# Patient Record
Sex: Female | Born: 2000 | Race: Black or African American | Hispanic: No | Marital: Single | Smoking: Never smoker
Health system: Southern US, Community
[De-identification: ages and names within clinical notes are randomized; demographics above are authoritative.]

## PROBLEM LIST (undated history)

## (undated) DIAGNOSIS — K2960 Other gastritis without bleeding: Secondary | ICD-10-CM

---

## 2020-05-13 ENCOUNTER — Emergency Department (HOSPITAL_BASED_OUTPATIENT_CLINIC_OR_DEPARTMENT_OTHER): Payer: Medicaid Other

## 2020-05-13 ENCOUNTER — Other Ambulatory Visit: Payer: Self-pay

## 2020-05-13 ENCOUNTER — Emergency Department (HOSPITAL_BASED_OUTPATIENT_CLINIC_OR_DEPARTMENT_OTHER)
Admission: EM | Admit: 2020-05-13 | Discharge: 2020-05-13 | Disposition: A | Discharge status: Home / Self Care | Payer: Medicaid Other | Attending: Emergency Medicine | Admitting: Emergency Medicine

## 2020-05-13 ENCOUNTER — Encounter (HOSPITAL_BASED_OUTPATIENT_CLINIC_OR_DEPARTMENT_OTHER): Payer: Self-pay | Admitting: *Deleted

## 2020-05-13 DIAGNOSIS — M79604 Pain in right leg: Secondary | ICD-10-CM | POA: Diagnosis present

## 2020-05-13 HISTORY — DX: Other gastritis without bleeding: K29.60

## 2020-05-13 MED ORDER — LIDOCAINE 5 % EX PTCH: 1.0000 | MEDICATED_PATCH | CUTANEOUS | 0 refills | Status: AC

## 2020-05-13 MED ORDER — NAPROXEN 500 MG PO TABS: 500.0000 mg | ORAL_TABLET | Freq: Two times a day (BID) | ORAL | 0 refills | Status: DC

## 2020-05-13 NOTE — Discharge Instructions (Signed)
Recommend Tylenol as needed for pain.  Make sure to ice and elevate your leg.  Follow-up with orthopedic if her symptoms are unresolved.  Return for any worsening symptoms.

## 2020-05-13 NOTE — ED Triage Notes (Signed)
Pt reports right knee pain after playing outside yesterday. Able to wb with pain, denies any specific injury or trauma, denies any other c/o.

## 2020-05-13 NOTE — ED Provider Notes (Signed)
[facility] EMERGENCY DEPARTMENT Provider Note   CSN: [id number] Arrival date & time: 05/13/20  4098    History Right leg pain  Jane Allen is a 19 y.o. female with no significant past medical history who presents for evaluation of right leg pain.  Began yesterday.  She denies any recent injury or trauma.  States she has pain located to her popliteal fossa on her right knee.  Will intermittently extend into her posterior right calf.  No recent surgery, immobilization, birth control, history of PE or DVT.  She denies any chest pain, shortness of breath.  No redness or swelling to legs.  Hurts more when she dorsiflexes her foot.  She denies any bony tenderness. States she was playing outside yesterday however she was fine after this and did not have any increase in pain.  She has never had anything like this before.  She is not take anything for symptoms.  She rates her pain a 5/10.  Has been able to ambulate however states she feels tightness to her posterior aspect of her calf. No bony pain. Denies additional aggravating or alleviating factors.   Denies change of pregnancy  History pain from patient and past medical records.  No interpreter used    HPI     Past Medical History:  Diagnosis Date  . Reflux gastritis     There are no problems to display for this patient.   History reviewed. No pertinent surgical history.   OB History   No obstetric history on file.     History reviewed. No pertinent family history.  Social History   Tobacco Use  . Smoking status: Never Smoker  . Smokeless tobacco: Never Used    Home Medications Prior to Admission medications   Medication Sig Start Date End Date Taking? Authorizing Provider  lidocaine (LIDODERM) 5 % Place 1 patch onto the skin daily. Remove & Discard patch within 12 hours or as directed by MD 05/13/20  Yes Harmani Neto A, PA-C  naproxen (NAPROSYN) 500 MG tablet Take 1 tablet (500 mg total) by mouth 2  (two) times daily. 05/13/20  Yes Briony Parveen A, PA-C    Allergies    Patient has no known allergies.  Review of Systems   Review of Systems  Constitutional: Negative.   HENT: Negative.   Respiratory: Negative.   Cardiovascular: Negative.   Gastrointestinal: Negative.   Genitourinary: Negative.   Musculoskeletal: Negative for gait problem.       Right calf pain  Skin: Negative.   Neurological: Negative.   All other systems reviewed and are negative.   Physical Exam Updated Vital Signs BP 120/78 (BP Location: Right Arm)   Pulse 78   Temp 98.2 F (36.8 C) (Oral)   Resp 18   Ht 5\' 1"  (1.549 m)   Wt 56.2 kg   SpO2 100%   BMI 23.43 kg/m   Physical Exam Vitals and nursing note reviewed.  Constitutional:      General: She is not in acute distress.    Appearance: She is well-developed and well-nourished. She is not ill-appearing, toxic-appearing or diaphoretic.  HENT:     Head: Normocephalic and atraumatic.     Nose: Nose normal.     Mouth/Throat:     Mouth: Mucous membranes are moist.  Eyes:     Pupils: Pupils are equal, round, and reactive to light.  Cardiovascular:     Rate and Rhythm: Normal rate.     Pulses: Normal pulses and intact  distal pulses.     Heart sounds: Normal heart sounds.  Pulmonary:     Effort: Pulmonary effort is normal. No respiratory distress.     Breath sounds: Normal breath sounds.  Abdominal:     General: Bowel sounds are normal. There is no distension.  Musculoskeletal:        General: Normal range of motion.     Cervical back: Normal range of motion.     Comments: No bony tenderness.  Moves all 4 extremities without difficulty.  Compartments soft.  Does have some soft tissue tenderness to her right popliteal fossa, right mid calf.  Negative Thompson test.  Negative Homans' sign.  Skin:    General: Skin is warm and dry.     Capillary Refill: Capillary refill takes less than 2 seconds.     Comments: No edema, erythema or warmth.  No  fluctuance or induration.  Neurological:     General: No focal deficit present.     Mental Status: She is alert and oriented to person, place, and time.  Psychiatric:        Mood and Affect: Mood and affect normal.    ED Results / Procedures / Treatments   Labs (all labs ordered are listed, but only abnormal results are displayed) Labs Reviewed - No data to display  EKG None  Radiology US Venous Img Lower Unilateral Right  Result Date: 05/13/2020 CLINICAL DATA:  Right popliteal vein EXAM: RIGHT LOWER EXTREMITY VENOUS DOPPLER ULTRASOUND TECHNIQUE: Gray-scale sonography with compression, as well as color and duplex ultrasound, were performed to evaluate the deep venous system(s) from the level of the common femoral vein through the popliteal and proximal calf veins. COMPARISON:  None. FINDINGS: VENOUS Normal compressibility of the common femoral, superficial femoral, and popliteal veins, as well as the visualized calf veins. Visualized portions of profunda femoral vein and great saphenous vein unremarkable. No filling defects to suggest DVT on grayscale or color Doppler imaging. Doppler waveforms show normal direction of venous flow, normal respiratory plasticity and response to augmentation. Limited views of the contralateral common femoral vein are unremarkable. OTHER None. Limitations: none IMPRESSION: Negative. Electronically Signed   By: Sharlet Salina M.D.   On: 05/13/2020 15:30   DG Knee Complete 4 Views Right  Result Date: 05/13/2020 CLINICAL DATA:  Right knee pain today. Patient was playing outside yesterday. No specific injury. EXAM: RIGHT KNEE - COMPLETE 4+ VIEW COMPARISON:  None. FINDINGS: No evidence of fracture, dislocation, or joint effusion. No evidence of arthropathy or other focal bone abnormality. Soft tissues are unremarkable. IMPRESSION: Negative. Electronically Signed   By: Amie Portland M.D.   On: 05/13/2020 12:19   Procedures .Ortho Injury Treatment  Date/Time:  05/13/2020 3:58 PM Performed by: Linwood Dibbles, PA-C Authorized by: Linwood Dibbles, PA-C   Consent:    Consent obtained:  Verbal   Consent given by:  Patient and parent   Risks discussed:  Fracture, nerve damage, restricted joint movement, vascular damage, stiffness, recurrent dislocation and irreducible dislocation   Alternatives discussed:  No treatment, alternative treatment, immobilization, referral and delayed treatmentInjury location: lower leg Location details: right lower leg Injury type: soft tissue Pre-procedure neurovascular assessment: neurovascularly intact Pre-procedure distal perfusion: normal Pre-procedure neurological function: normal Pre-procedure range of motion: normal  Anesthesia: Local anesthesia used: no  Patient sedated: NoImmobilization: crutches Post-procedure neurovascular assessment: post-procedure neurovascularly intact Post-procedure distal perfusion: normal Post-procedure neurological function: normal Post-procedure range of motion: normal Patient tolerance: patient tolerated the procedure well with  no immediate complications    (including critical care time)  Medications Ordered in ED Medications - No data to display  ED Course  I have reviewed the triage vital signs and the nursing notes.  Pertinent labs & imaging results that were available during my care of the patient were reviewed by me and considered in my medical decision making (see chart for details).  19 year old presents for evaluation of right lower extremity pain.  She is afebrile, nonseptic, non-ill-appearing.  Pain worse with stretching her leg.  She has no edema, erythema or warmth.  No fluctuance or induration.  No clinical evidence of cellulitis or abscess on exam.  Her compartments are soft.  Does have tenderness to her popliteal fossa as well as over her mid gastroc muscles.  She is neurovascularly intact.  X-ray knee obtained from triage is without fracture,  dislocation or effusion.  Her ultrasound here is negative for DVT.  Suspect tendinitis.  She was given crutches for comfort.  No evidence of acute Achilles tendon rupture.  Discussed NSAIDs, ice, elevation, rest and follow-up with orthopedics.  She is agreeable for this.  The patient has been appropriately medically screened and/or stabilized in the ED. I have low suspicion for any other emergent medical condition which would require further screening, evaluation or treatment in the ED or require inpatient management.  Patient is hemodynamically stable and in no acute distress.  Patient able to ambulate in department prior to ED.  Evaluation does not show acute pathology that would require ongoing or additional emergent interventions while in the emergency department or further inpatient treatment.  I have discussed the diagnosis with the patient and answered all questions.  Pain is been managed while in the emergency department and patient has no further complaints prior to discharge.  Patient is comfortable with plan discussed in room and is stable for discharge at this time.  I have discussed strict return precautions for returning to the emergency department.  Patient was encouraged to follow-up with PCP/specialist refer to at discharge.     MDM Rules/Calculators/A&P                           Final Clinical Impression(s) / ED Diagnoses Final diagnoses:  Right leg pain    Rx / DC Orders ED Discharge Orders         Ordered    naproxen (NAPROSYN) 500 MG tablet  2 times daily        05/13/20 1557    lidocaine (LIDODERM) 5 %  Every 24 hours        05/13/20 1557           Jacquelina Hewins A, PA-C 05/13/20 1558    Tilden Fossa, MD 05/14/20 [phone]

## 2020-06-23 ENCOUNTER — Encounter (HOSPITAL_BASED_OUTPATIENT_CLINIC_OR_DEPARTMENT_OTHER): Payer: Self-pay

## 2020-06-23 ENCOUNTER — Other Ambulatory Visit: Payer: Self-pay

## 2020-06-23 ENCOUNTER — Emergency Department (HOSPITAL_BASED_OUTPATIENT_CLINIC_OR_DEPARTMENT_OTHER)
Admission: EM | Admit: 2020-06-23 | Discharge: 2020-06-23 | Disposition: A | Discharge status: Home / Self Care | Payer: Medicaid Other | Attending: Emergency Medicine | Admitting: Emergency Medicine

## 2020-06-23 DIAGNOSIS — M25551 Pain in right hip: Secondary | ICD-10-CM | POA: Insufficient documentation

## 2020-06-23 LAB — PREGNANCY, URINE: Preg Test, Ur: NEGATIVE

## 2020-06-23 MED ORDER — IBUPROFEN 800 MG PO TABS
800.0000 mg | ORAL_TABLET | Freq: Once | ORAL | Status: AC
  Administered 2020-06-23: 800 mg via ORAL
  Filled 2020-06-23: qty 1

## 2020-06-23 MED ORDER — DICLOFENAC SODIUM 1 % EX GEL: 2.0000 g | Freq: Four times a day (QID) | CUTANEOUS | 0 refills | Status: DC

## 2020-06-23 NOTE — ED Provider Notes (Signed)
[facility] EMERGENCY DEPARTMENT Provider Note   CSN: [id number] Arrival date & time: 06/23/20  1334     History Chief Complaint  Patient presents with  . Hip Pain    Jane Allen is a 20 y.o. female.  HPI 20 year old female who presents to the ER with 2 days of right hip pain. Patient reports significant pain with ambulation which began on Tuesday. She states that she was helping carry a gate to the driveway. She denies any numbness drinking, or loss of bowel bladder control. No history of IV drug use, no fevers or chills. No unintended weight loss. Denies any falls or injuries. She has not taken anything for her symptoms. Has pain with ambulation.  Past Medical History:  Diagnosis Date  . Reflux gastritis     There are no problems to display for this patient.   History reviewed. No pertinent surgical history.   OB History   No obstetric history on file.     No family history on file.  Social History   Tobacco Use  . Smoking status: Never Smoker  . Smokeless tobacco: Never Used  Vaping Use  . Vaping Use: Never used  Substance Use Topics  . Alcohol use: Never  . Drug use: Never    Home Medications Prior to Admission medications   Medication Sig Start Date End Date Taking? Authorizing Provider  diclofenac Sodium (VOLTAREN) 1 % GEL Apply 2 g topically 4 (four) times daily. 06/23/20  Yes Trudee Grip A, PA-C  lidocaine (LIDODERM) 5 % Place 1 patch onto the skin daily. Remove & Discard patch within 12 hours or as directed by MD 05/13/20   Henderly, Britni A, PA-C  naproxen (NAPROSYN) 500 MG tablet Take 1 tablet (500 mg total) by mouth 2 (two) times daily. 05/13/20   Henderly, Britni A, PA-C    Allergies    Patient has no known allergies.  Review of Systems   Review of Systems  Constitutional: Negative for chills and fever.  Musculoskeletal: Positive for arthralgias and gait problem. Negative for joint swelling.  Neurological: Negative for weakness  and numbness.    Physical Exam Updated Vital Signs BP 104/87 (BP Location: Right Arm)   Pulse 77   Temp 98.3 F (36.8 C) (Oral)   Resp 18   Ht 5\' 1"  (1.549 m)   Wt 59.4 kg   LMP 06/15/2020   SpO2 100%   BMI 24.75 kg/m   Physical Exam Vitals and nursing note reviewed.  Constitutional:      General: She is not in acute distress.    Appearance: She is well-developed and well-nourished. She is not ill-appearing or diaphoretic.  HENT:     Head: Normocephalic and atraumatic.  Eyes:     Conjunctiva/sclera: Conjunctivae normal.  Cardiovascular:     Rate and Rhythm: Normal rate and regular rhythm.     Heart sounds: No murmur heard.   Pulmonary:     Effort: Pulmonary effort is normal. No respiratory distress.     Breath sounds: Normal breath sounds.  Abdominal:     Palpations: Abdomen is soft.     Tenderness: There is no abdominal tenderness.  Musculoskeletal:        General: Tenderness present. No swelling, deformity, signs of injury or edema.     Cervical back: Neck supple.     Right lower leg: No edema.     Left lower leg: No edema.     Comments: Mild tenderness to palpation over the right  greater trochanter on the right hip.  She has full passive and active flexion and extension of the right hip.  Intact plantar flexion dorsiflexion of the ankle, 5/5 strength in lower extremities, mild limp with ambulation secondary to pain but able to bear weight with no difficulty.  Skin:    General: Skin is warm and dry.  Neurological:     General: No focal deficit present.     Mental Status: She is alert and oriented to person, place, and time.  Psychiatric:        Mood and Affect: Mood and affect and mood normal.        Behavior: Behavior normal.     ED Results / Procedures / Treatments   Labs (all labs ordered are listed, but only abnormal results are displayed) Labs Reviewed  PREGNANCY, URINE    EKG None  Radiology No results found.  Procedures Procedures    Medications Ordered in ED Medications  ibuprofen (ADVIL) tablet 800 mg (800 mg Oral Given 06/23/20 1637)    ED Course  I have reviewed the triage vital signs and the nursing notes.  Pertinent labs & imaging results that were available during my care of the patient were reviewed by me and considered in my medical decision making (see chart for details).    MDM Rules/Calculators/A&P                          20 year old female with 2 days of right hip pain.  On arrival, vitals reassuring, physical exam reassuring, full range of motion of the right hip, strength and sensations are intact.  Denies any trauma.  We discussed possible plain films, however given no recent trauma or falls, with reassuring physical exam, low suspicion for fractures or dislocations.  Patient elected forego imaging at this time.  She was ambulated being here in the ER with a mild limp but with no significant difficulty.  I encouraged ibuprofen for pain, will provide Voltaren gel as well.  Encouraged gentle stretching.  Stressed PCP follow-up if this continues.  We discussed return precautions.  All of her questions been answered to her satisfaction, she voiced understanding is agreeable.  At this stage in the ED course, the patient is medically screened and stable for discharge Final Clinical Impression(s) / ED Diagnoses Final diagnoses:  Right hip pain    Rx / DC Orders ED Discharge Orders         Ordered    diclofenac Sodium (VOLTAREN) 1 % GEL  4 times daily        06/23/20 1702           Leone Brand 06/23/20 1712    Pricilla Loveless, MD 06/23/20 [phone]

## 2020-06-23 NOTE — Discharge Instructions (Signed)
Please take Tylenol or ibuprofen for your symptoms. You may also use the topical anti-inflammatory cream that I have prescribed. Apply ice or heat to your hip as well. Use gentle stretching, examples provided you may take up to 800 mg of ibuprofen up to 3 times daily. Please make sure to follow-up with your primary care doctor for symptoms continue.

## 2020-06-23 NOTE — ED Triage Notes (Signed)
Pt c/o pain to right hip x 2 days "feels like a knot"-denies injury-NAD-limping gait

## 2021-03-19 ENCOUNTER — Emergency Department (HOSPITAL_BASED_OUTPATIENT_CLINIC_OR_DEPARTMENT_OTHER)
Admission: EM | Admit: 2021-03-19 | Discharge: 2021-03-20 | Disposition: A | Discharge status: Home / Self Care | Payer: Medicaid Other | Attending: Emergency Medicine | Admitting: Emergency Medicine

## 2021-03-19 ENCOUNTER — Encounter (HOSPITAL_BASED_OUTPATIENT_CLINIC_OR_DEPARTMENT_OTHER): Payer: Self-pay | Admitting: *Deleted

## 2021-03-19 ENCOUNTER — Other Ambulatory Visit: Payer: Self-pay

## 2021-03-19 DIAGNOSIS — Z5321 Procedure and treatment not carried out due to patient leaving prior to being seen by health care provider: Secondary | ICD-10-CM | POA: Diagnosis not present

## 2021-03-19 DIAGNOSIS — R509 Fever, unspecified: Secondary | ICD-10-CM | POA: Diagnosis not present

## 2021-03-19 DIAGNOSIS — R059 Cough, unspecified: Secondary | ICD-10-CM | POA: Diagnosis present

## 2021-03-19 LAB — RAPID INFLUENZA A&B ANTIGENS
Influenza A (ARMC): NEGATIVE
Influenza B (ARMC): NEGATIVE

## 2021-03-19 NOTE — ED Triage Notes (Addendum)
Cough and chills x 3 days- has had contacts +flu- also fever- took tylenol pta

## 2021-03-21 ENCOUNTER — Encounter (HOSPITAL_BASED_OUTPATIENT_CLINIC_OR_DEPARTMENT_OTHER): Payer: Self-pay | Admitting: Emergency Medicine

## 2021-03-21 ENCOUNTER — Other Ambulatory Visit: Payer: Self-pay

## 2021-03-21 ENCOUNTER — Emergency Department (HOSPITAL_BASED_OUTPATIENT_CLINIC_OR_DEPARTMENT_OTHER)
Admission: EM | Admit: 2021-03-21 | Discharge: 2021-03-21 | Disposition: A | Discharge status: Home / Self Care | Payer: Medicaid Other | Attending: Emergency Medicine | Admitting: Emergency Medicine

## 2021-03-21 DIAGNOSIS — B349 Viral infection, unspecified: Secondary | ICD-10-CM | POA: Insufficient documentation

## 2021-03-21 DIAGNOSIS — R509 Fever, unspecified: Secondary | ICD-10-CM | POA: Diagnosis present

## 2021-03-21 NOTE — ED Provider Notes (Signed)
[facility] EMERGENCY DEPARTMENT Provider Note  CSN: [id number] Arrival date & time: 03/21/21 0012    History Chief Complaint  Patient presents with   flu like symptoms     Jane Allen is a 20 y.o. female reports 3 days of viral symptoms, subjective fever, cough, congestion. Here yesterday and had neg influenza only swab but did not stay to be seen.    Past Medical History:  Diagnosis Date   Reflux gastritis     History reviewed. No pertinent surgical history.  Family History  Problem Relation Age of Onset   Hypertension Mother    Hypertension Other     Social History   Tobacco Use   Smoking status: Never   Smokeless tobacco: Never  Vaping Use   Vaping Use: Some days   Substances: Nicotine  Substance Use Topics   Alcohol use: Never   Drug use: Not Currently    Types: Marijuana     Home Medications Prior to Admission medications   Medication Sig Start Date End Date Taking? Authorizing Provider  diclofenac Sodium (VOLTAREN) 1 % GEL Apply 2 g topically 4 (four) times daily. 06/23/20   Mare Ferrari, PA-C  lidocaine (LIDODERM) 5 % Place 1 patch onto the skin daily. Remove & Discard patch within 12 hours or as directed by MD 05/13/20   Henderly, Britni A, PA-C  naproxen (NAPROSYN) 500 MG tablet Take 1 tablet (500 mg total) by mouth 2 (two) times daily. 05/13/20   Henderly, Britni A, PA-C     Allergies    Patient has no known allergies.   Review of Systems   Review of Systems .A comprehensive review of systems was completed and negative except as noted in HPI.     Physical Exam BP 140/90 (BP Location: Right Arm)   Pulse 72   Temp 98.3 F (36.8 C) (Oral)   Resp 14   Ht 5\' 1"  (1.549 m)   Wt 59 kg   LMP 02/23/2021 (Exact Date)   SpO2 100%   BMI 24.56 kg/m   Physical Exam Vitals and nursing note reviewed.  Constitutional:      Appearance: Normal appearance.  HENT:     Head: Normocephalic and atraumatic.     Nose: Nose normal.      Mouth/Throat:     Mouth: Mucous membranes are moist.  Eyes:     Extraocular Movements: Extraocular movements intact.     Conjunctiva/sclera: Conjunctivae normal.  Cardiovascular:     Rate and Rhythm: Normal rate.  Pulmonary:     Effort: Pulmonary effort is normal.     Breath sounds: Normal breath sounds.  Abdominal:     General: Abdomen is flat.     Palpations: Abdomen is soft.     Tenderness: There is no abdominal tenderness.  Musculoskeletal:        General: No swelling. Normal range of motion.     Cervical back: Neck supple.  Skin:    General: Skin is warm and dry.  Neurological:     General: No focal deficit present.     Mental Status: She is alert.  Psychiatric:        Mood and Affect: Mood normal.     ED Results / Procedures / Treatments   Labs (all labs ordered are listed, but only abnormal results are displayed) Labs Reviewed - No data to display  EKG None   Radiology No results found.  Procedures Procedures  Medications Ordered in the ED Medications -  No data to display   MDM Rules/Calculators/A&P MDM Patient with viral syndrome, recommend OTC symptomatic care. Isolation until improved.   ED Course  I have reviewed the triage vital signs and the nursing notes.  Pertinent labs & imaging results that were available during my care of the patient were reviewed by me and considered in my medical decision making (see chart for details).     Final Clinical Impression(s) / ED Diagnoses Final diagnoses:  Viral syndrome    Rx / DC Orders ED Discharge Orders     None        Pollyann Savoy, MD 03/21/21 0128

## 2021-03-21 NOTE — ED Triage Notes (Signed)
Pt is c/o shortness of breath, congestion, back pain and nausea  Pt states her sxs started 2 to 3 days ago

## 2021-12-02 IMAGING — CR DG KNEE COMPLETE 4+V*R*
4 series · 4 of 4 positions shown · non-contrast
Comparison: None.

CLINICAL DATA: Right knee pain today. Patient was playing outside
yesterday. No specific injury.

EXAM:
RIGHT KNEE - COMPLETE 4+ VIEW

[t knee ap right]
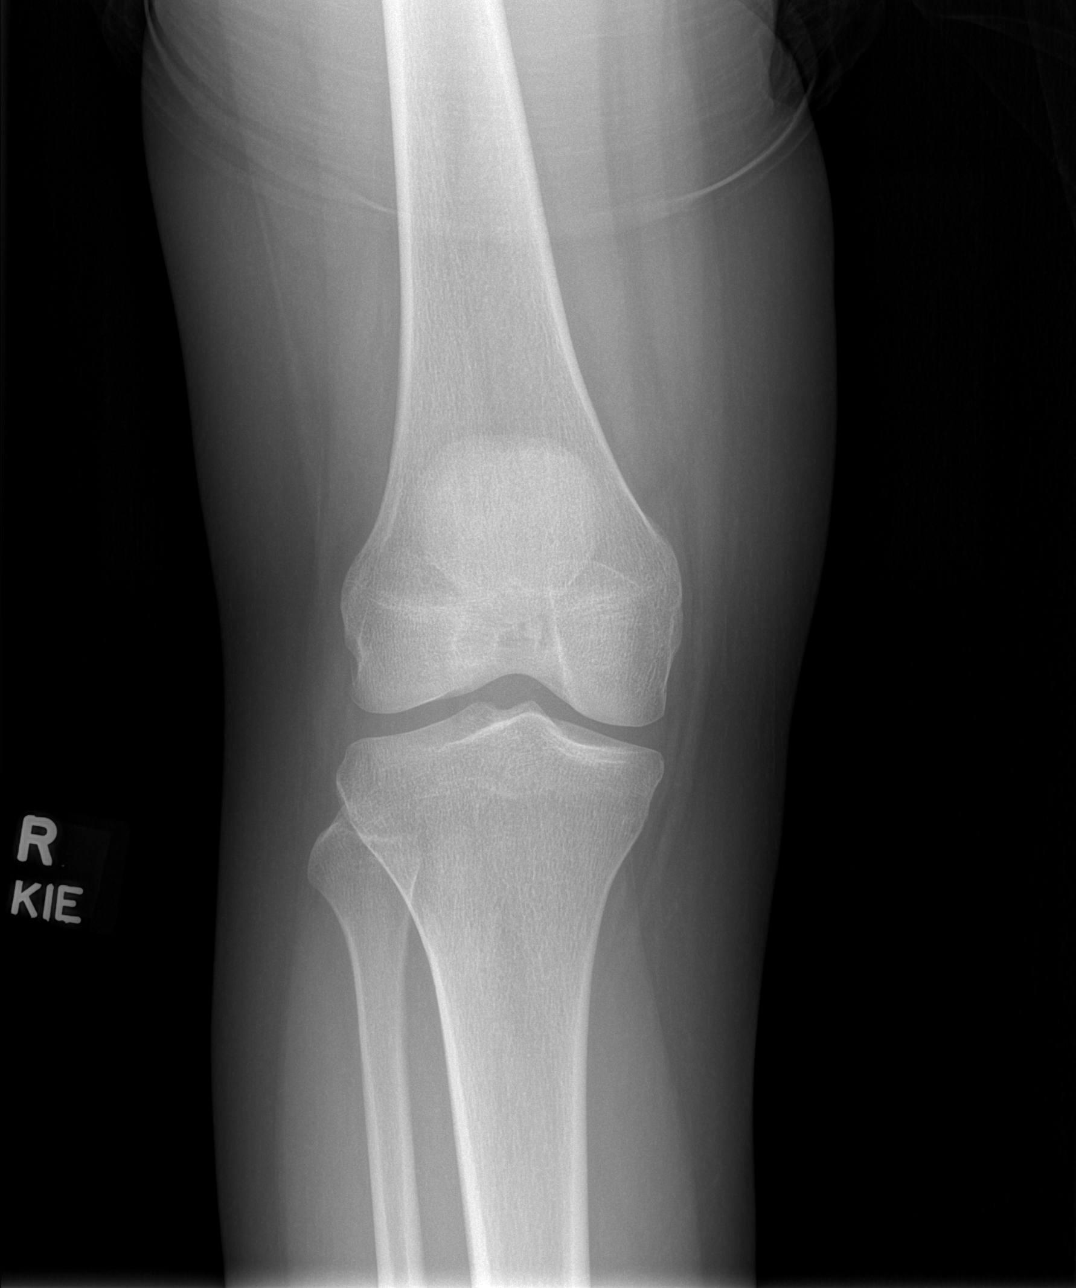

[t knee oblique right (1 of 2)]
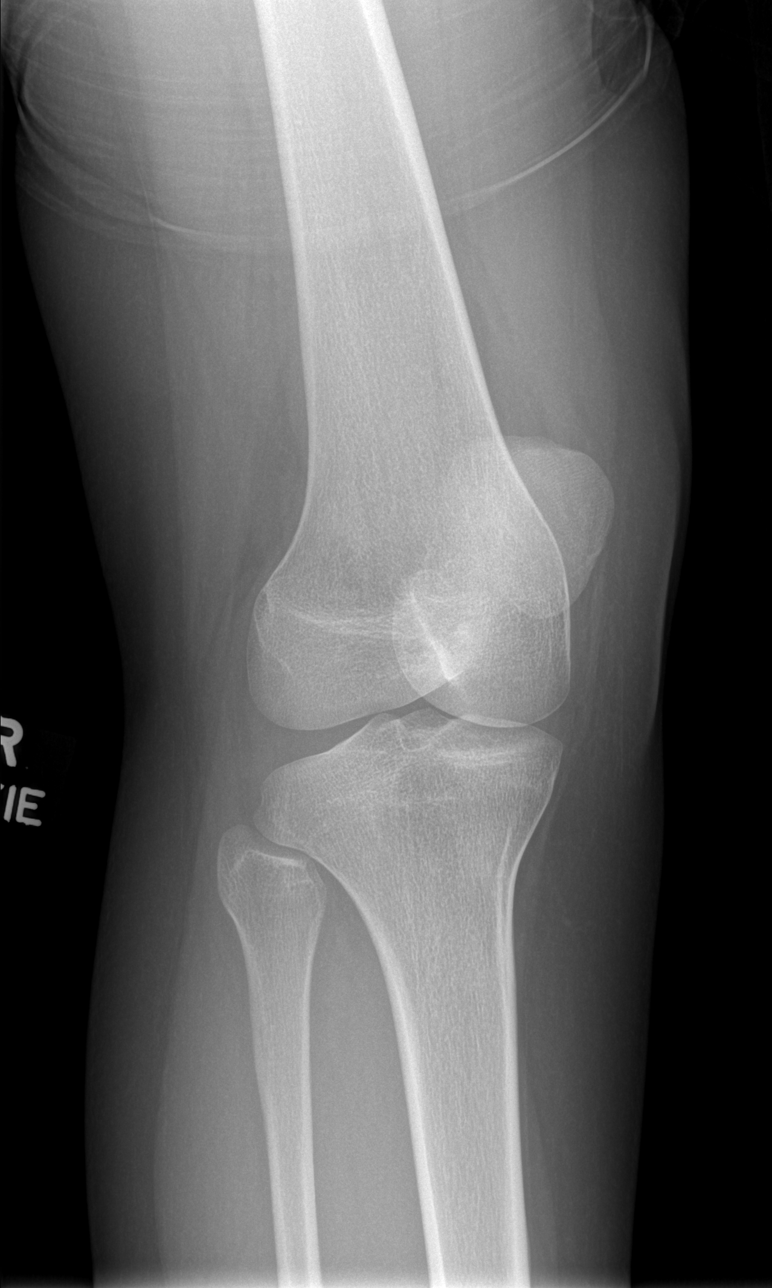

[t knee oblique right (2 of 2)]
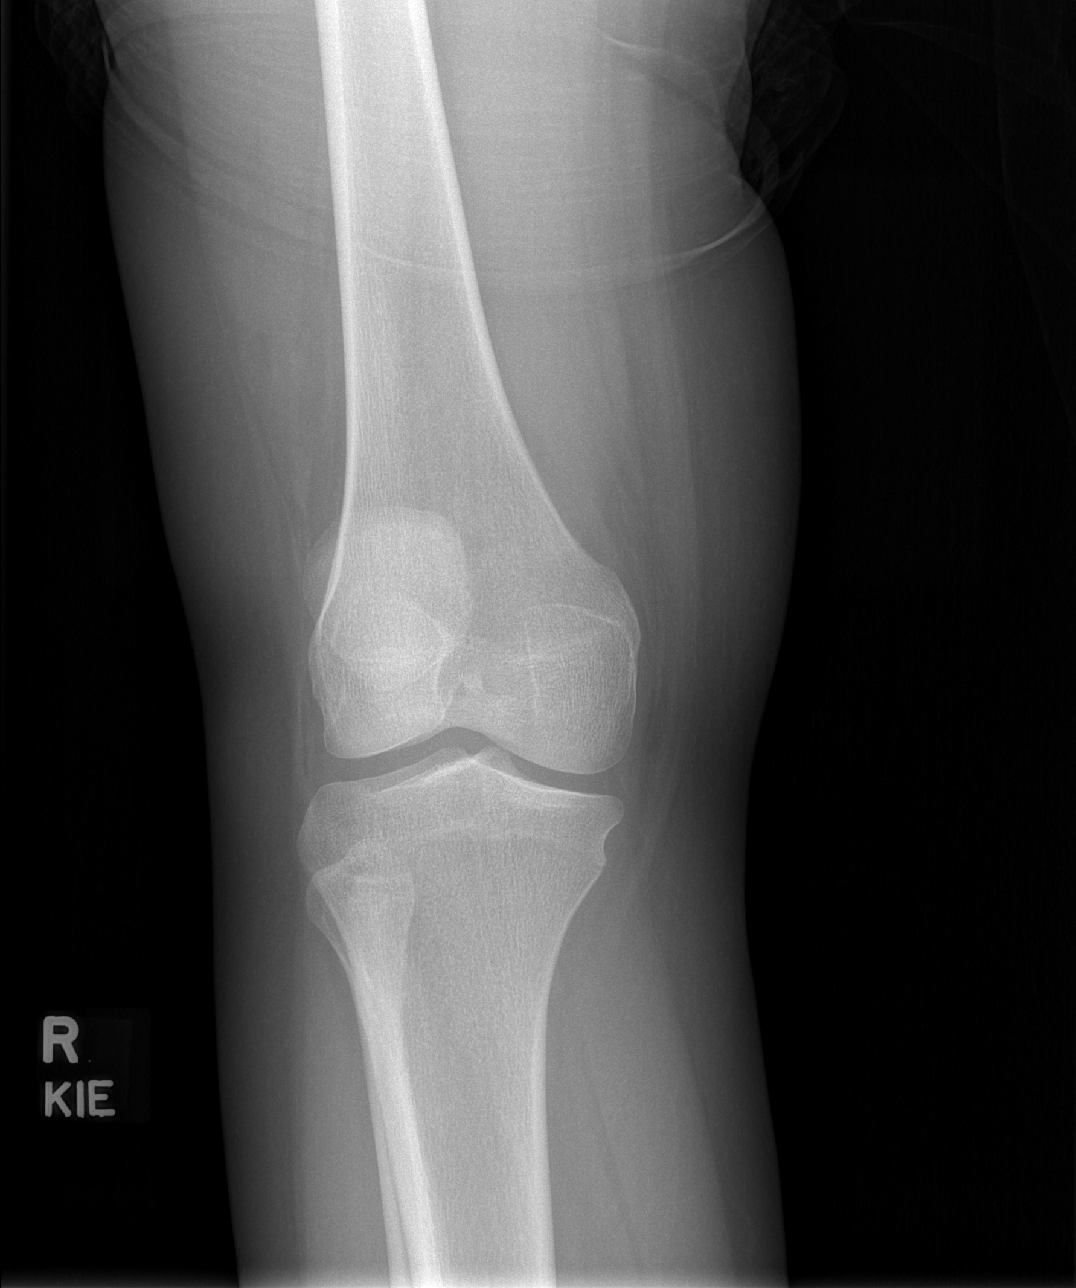

[t knee lat right]
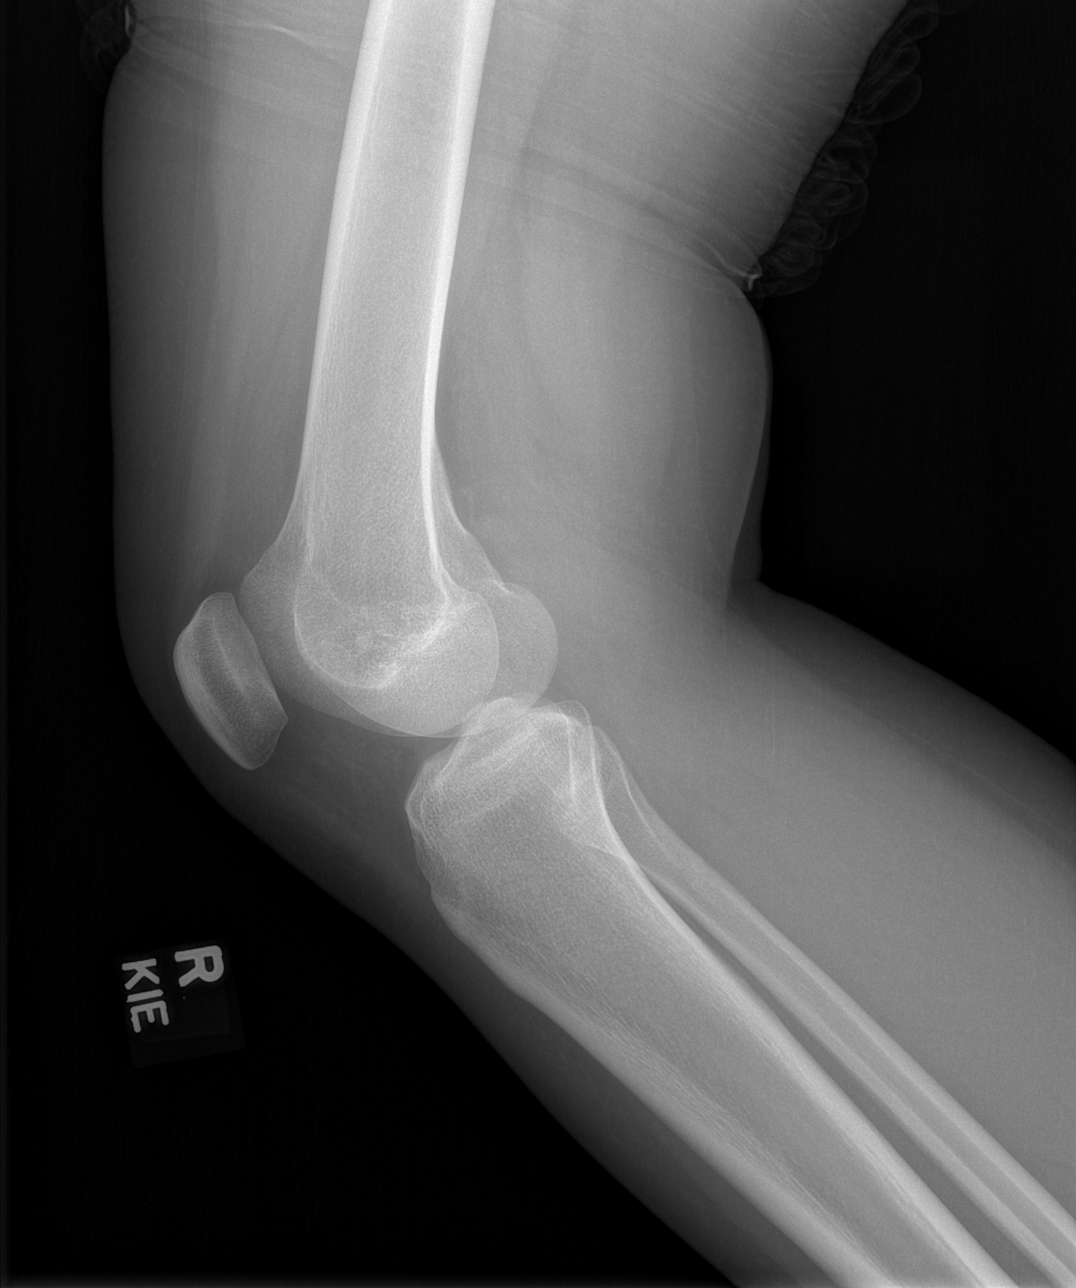

[4 of 4 positions shown; findings below may reference images not displayed]

FINDINGS: No evidence of fracture, dislocation, or joint effusion. No evidence
of arthropathy or other focal bone abnormality. Soft tissues are
unremarkable.
IMPRESSION: Negative.

## 2022-03-31 ENCOUNTER — Other Ambulatory Visit: Payer: Self-pay

## 2022-03-31 ENCOUNTER — Emergency Department (HOSPITAL_BASED_OUTPATIENT_CLINIC_OR_DEPARTMENT_OTHER)
Admission: EM | Admit: 2022-03-31 | Discharge: 2022-03-31 | Disposition: A | Discharge status: Home / Self Care | Payer: BC Managed Care – PPO | Attending: Emergency Medicine | Admitting: Emergency Medicine

## 2022-03-31 DIAGNOSIS — R102 Pelvic and perineal pain: Secondary | ICD-10-CM | POA: Diagnosis present

## 2022-03-31 LAB — WET PREP, GENITAL
Clue Cells Wet Prep HPF POC: NONE SEEN
Sperm: NONE SEEN
Trich, Wet Prep: NONE SEEN
WBC, Wet Prep HPF POC: <10 (ref <10)
Yeast Wet Prep HPF POC: NONE SEEN

## 2022-03-31 LAB — URINALYSIS, MICROSCOPIC (REFLEX)

## 2022-03-31 LAB — URINALYSIS, ROUTINE W REFLEX MICROSCOPIC
Bilirubin Urine: NEGATIVE
Glucose, UA: NEGATIVE mg/dL
Ketones, ur: NEGATIVE mg/dL
Leukocytes,Ua: NEGATIVE
Nitrite: NEGATIVE
Protein, ur: NEGATIVE mg/dL
Specific Gravity, Urine: >=1.03 (ref 1.005–1.030)
pH: 6 (ref 5.0–8.0)

## 2022-03-31 LAB — PREGNANCY, URINE: Preg Test, Ur: NEGATIVE

## 2022-03-31 NOTE — ED Triage Notes (Signed)
Pt reports LLQ pain x 4 days. LMP 11/11 (a few days early). She states that period was not typical- shorter and lighter than usual. Pt states the pain "is starting to go away" and "feels like her period is coming again". Nausea in the am and tight feeling over center of pelvis before her cycle came on.

## 2022-03-31 NOTE — ED Provider Notes (Signed)
[facility] EMERGENCY DEPARTMENT Provider Note   CSN: [id number] Arrival date & time: 03/31/22  2111     History  Chief Complaint  Patient presents with   Abdominal Pain    Jane Allen is a 21 y.o. female.  Patient arrives with lower pelvic pressure, she is concerned maybe for pregnancy.  She had a negative pregnancy test a few weeks ago.  She had a shorter than normal period.  She has not been having any nausea or vomiting.  Not have any active abdominal pain now.  She is not really concerned about STDs but she has had unprotected sex.  She not having any vaginal discharge.  Denies any major medical problems.  No significant medical history otherwise.  No history of abdominal surgeries.  The history is provided by the patient.       Home Medications Prior to Admission medications   Medication Sig Start Date End Date Taking? Authorizing Provider  diclofenac Sodium (VOLTAREN) 1 % GEL Apply 2 g topically 4 (four) times daily. 06/23/20   Mare Ferrari, PA-C  lidocaine (LIDODERM) 5 % Place 1 patch onto the skin daily. Remove & Discard patch within 12 hours or as directed by MD 05/13/20   Henderly, Britni A, PA-C  naproxen (NAPROSYN) 500 MG tablet Take 1 tablet (500 mg total) by mouth 2 (two) times daily. 05/13/20   Henderly, Britni A, PA-C      Allergies    Patient has no known allergies.    Review of Systems   Review of Systems  Physical Exam Updated Vital Signs BP (!) 143/101 (BP Location: Right Arm)   Pulse (!) 109   Temp 98 F (36.7 C) (Oral)   Resp 18   Ht 5\' 1"  (1.549 m)   Wt 61.7 kg   LMP 03/24/2022   SpO2 100%   BMI 25.70 kg/m  Physical Exam Vitals and nursing note reviewed.  Constitutional:      General: She is not in acute distress.    Appearance: She is well-developed. She is not ill-appearing.  HENT:     Head: Normocephalic and atraumatic.  Eyes:     Conjunctiva/sclera: Conjunctivae normal.  Cardiovascular:     Rate and Rhythm: Normal  rate and regular rhythm.     Heart sounds: Normal heart sounds. No murmur heard. Pulmonary:     Effort: Pulmonary effort is normal. No respiratory distress.     Breath sounds: Normal breath sounds.  Abdominal:     Palpations: Abdomen is soft.     Tenderness: There is no abdominal tenderness. There is no guarding or rebound.  Musculoskeletal:        General: No swelling.     Cervical back: Neck supple.  Skin:    General: Skin is warm and dry.     Capillary Refill: Capillary refill takes less than 2 seconds.  Neurological:     Mental Status: She is alert.  Psychiatric:        Mood and Affect: Mood normal.     ED Results / Procedures / Treatments   Labs (all labs ordered are listed, but only abnormal results are displayed) Labs Reviewed  URINALYSIS, ROUTINE W REFLEX MICROSCOPIC - Abnormal; Notable for the following components:      Result Value   Hgb urine dipstick TRACE (*)    All other components within normal limits  URINALYSIS, MICROSCOPIC (REFLEX) - Abnormal; Notable for the following components:   Bacteria, UA MANY (*)  All other components within normal limits  WET PREP, GENITAL  PREGNANCY, URINE  GC/CHLAMYDIA PROBE AMP ([place]) NOT AT [facility]    EKG None  Radiology No results found.  Procedures Procedures    Medications Ordered in ED Medications - No data to display  ED Course/ Medical Decision Making/ A&P                           Medical Decision Making Amount and/or Complexity of Data Reviewed Labs: ordered.   Lynna Zamorano is here with lower pelvic pressure at times here recently.  She is mostly concerned for pregnancy.  Normal vitals.  No fever.  She has no abdominal tenderness on exam.  She is very well-appearing.  No significant comorbidities.  Overall she would like to be tested for pregnancy.  I offered her STD testing which she was hesitant at first but she will self swab for gonorrhea, chlamydia and trichomonas.  I have no concern for  appendicitis or ovarian cyst or torsion as her exam is unremarkable.  Overall sounds like she had an atypical.  Will rule out pregnancy and STD.  Per my review and interpretation of labs, wet prep is negative.  Urinalysis negative for infection.  Pregnancy test negative.  She will follow-up gonorrhea and Chlamydia test on MyChart.  She has no pain on exam.  She is well-appearing.  Given reassurance and discharged in the ED in good condition.  This chart was dictated using voice recognition software.  Despite best efforts to proofread,  errors can occur which can change the documentation meaning.          Final Clinical Impression(s) / ED Diagnoses Final diagnoses:  Pelvic pain in female    Rx / DC Orders ED Discharge Orders     None         Virgina Norfolk, DO 03/31/22 2212

## 2022-03-31 NOTE — Discharge Instructions (Signed)
Follow-up gonorrhea and Chlamydia testing on your MyChart.

## 2022-04-02 LAB — GC/CHLAMYDIA PROBE AMP (~~LOC~~) NOT AT ARMC
Chlamydia: NEGATIVE
Comment: NEGATIVE
Comment: NORMAL
Neisseria Gonorrhea: NEGATIVE

## 2022-07-23 ENCOUNTER — Emergency Department (HOSPITAL_BASED_OUTPATIENT_CLINIC_OR_DEPARTMENT_OTHER): Payer: BC Managed Care – PPO

## 2022-07-23 ENCOUNTER — Emergency Department (HOSPITAL_BASED_OUTPATIENT_CLINIC_OR_DEPARTMENT_OTHER)
Admission: EM | Admit: 2022-07-23 | Discharge: 2022-07-24 | Disposition: A | Discharge status: Home / Self Care | Payer: BC Managed Care – PPO | Attending: Emergency Medicine | Admitting: Emergency Medicine

## 2022-07-23 ENCOUNTER — Other Ambulatory Visit: Payer: Self-pay

## 2022-07-23 ENCOUNTER — Encounter (HOSPITAL_BASED_OUTPATIENT_CLINIC_OR_DEPARTMENT_OTHER): Payer: Self-pay

## 2022-07-23 DIAGNOSIS — W01198A Fall on same level from slipping, tripping and stumbling with subsequent striking against other object, initial encounter: Secondary | ICD-10-CM | POA: Insufficient documentation

## 2022-07-23 DIAGNOSIS — M79601 Pain in right arm: Secondary | ICD-10-CM | POA: Insufficient documentation

## 2022-07-23 DIAGNOSIS — M25511 Pain in right shoulder: Secondary | ICD-10-CM | POA: Diagnosis not present

## 2022-07-23 NOTE — Discharge Instructions (Addendum)
You were seen in the ER for right arm pain today.  As we discussed I think you could have sprained either your shoulder and your elbow after your fall the other day.  We have given you a sling that you can wear.  Please use acetaminophen (Tylenol) or ibuprofen (Advil, Motrin) for pain.  You may use 800 mg ibuprofen every 6 hours or 1000 mg of acetaminophen every 6 hours.  You may choose to alternate between the two, this would be most effective. Do not exceed 4000 mg of acetaminophen within 24 hours.  Do not exceed 3200 mg ibuprofen within 24 hours.  Please follow up with the orthopedic doctor if your symptoms do not improve.

## 2022-07-23 NOTE — ED Provider Notes (Signed)
[place] EMERGENCY DEPARTMENT AT [facility] Provider Note   CSN: [id number] Arrival date & time: 07/23/22  2014     History  Chief Complaint  Patient presents with   Lytle Michaels    Jane Allen is a 22 y.o. female history of GERD who presents the emergency department complaining of right arm pain after an injury 3 days ago.  Patient states that she tripped and hit her right elbow, and then hit her head.  There is no loss of consciousness.  She has been having some persistent right shoulder and right elbow pain.  She took some Tylenol the other day.  Reports pain is worse when lifting her arm directly in front of her out to the side.   Fall       Home Medications Prior to Admission medications   Medication Sig Start Date End Date Taking? Authorizing Provider  diclofenac Sodium (VOLTAREN) 1 % GEL Apply 2 g topically 4 (four) times daily. 06/23/20   Garald Balding, PA-C  lidocaine (LIDODERM) 5 % Place 1 patch onto the skin daily. Remove & Discard patch within 12 hours or as directed by MD 05/13/20   Henderly, Britni A, PA-C  naproxen (NAPROSYN) 500 MG tablet Take 1 tablet (500 mg total) by mouth 2 (two) times daily. 05/13/20   Henderly, Britni A, PA-C      Allergies    Doxycycline    Review of Systems   Review of Systems  Musculoskeletal:  Positive for arthralgias.  All other systems reviewed and are negative.   Physical Exam Updated Vital Signs BP 128/79 (BP Location: Left Arm)   Pulse 89   Temp 98.4 F (36.9 C) (Oral)   Resp 20   Wt 63.5 kg   SpO2 100%   BMI 26.45 kg/m  Physical Exam Vitals and nursing note reviewed.  Constitutional:      Appearance: Normal appearance.  HENT:     Head: Normocephalic and atraumatic.  Eyes:     Conjunctiva/sclera: Conjunctivae normal.  Cardiovascular:     Pulses:          Radial pulses are 2+ on the right side and 2+ on the left side.  Pulmonary:     Effort: Pulmonary effort is normal. No respiratory distress.   Musculoskeletal:     Comments: Some tenderness to palpation of the anterior right shoulder, without bony deformity.  Pain at the extremes of shoulder flexion and abduction.  Normal passive ROM of the left elbow.  Normal grip strength.  Neurovascular intact in bilateral upper extremities.  Compartments are soft.  Skin:    General: Skin is warm and dry.  Neurological:     Mental Status: She is alert.  Psychiatric:        Mood and Affect: Mood normal.        Behavior: Behavior normal.     ED Results / Procedures / Treatments   Labs (all labs ordered are listed, but only abnormal results are displayed) Labs Reviewed - No data to display  EKG None  Radiology DG Elbow Complete Right  Result Date: 07/23/2022 CLINICAL DATA:  Fall EXAM: RIGHT ELBOW - COMPLETE 3+ VIEW COMPARISON:  None Available. FINDINGS: There is no evidence of fracture or dislocation. Small elbow joint effusion. There is no evidence of arthropathy or other focal bone abnormality. Soft tissues are unremarkable. IMPRESSION: Small elbow joint effusion without evidence of acute fracture or dislocation. Electronically Signed   By: Placido Sou M.D.   On:  07/23/2022 21:11   DG Shoulder Right  Result Date: 07/23/2022 CLINICAL DATA:  fall EXAM: RIGHT SHOULDER - 2+ VIEW COMPARISON:  None Available. FINDINGS: There is no evidence of fracture or dislocation. There is no evidence of arthropathy or other focal bone abnormality. Soft tissues are unremarkable. IMPRESSION: Negative. Electronically Signed   By: Iven Finn M.D.   On: 07/23/2022 21:09    Procedures Procedures    Medications Ordered in ED Medications - No data to display  ED Course/ Medical Decision Making/ A&P                             Medical Decision Making Amount and/or Complexity of Data Reviewed Radiology: ordered.   This patient is a 22 y.o. female  who presents to the ED for concern of right arm pain after fall 3 days ago.   Differential  diagnoses prior to evaluation: The emergent differential diagnosis includes, but is not limited to, fracture, dislocation, ligamentous injury. This is not an exhaustive differential.   Past Medical History / Co-morbidities: Reflux  Physical Exam: Physical exam performed. The pertinent findings include: Pain reproducible with palpation of the anterior shoulder, and extremes of shoulder flexion and abduction.  Normal passive ROM of the right elbow.  Normal grip strength.  Compartments are soft.  Neurovascular intact in bilateral upper extremities.  Lab Tests/Imaging studies: I personally interpreted labs/imaging and the pertinent results include:  X-ray of the right shoulder unremarkable, x-ray of the right elbow with small elbow joint effusion, no evidence of fracture or dislocation.. I agree with the radiologist interpretation.  Treatment: Patient placed in sling for comfort.   Disposition: After consideration of the diagnostic results and the patients response to treatment, I feel that emergency department workup does not suggest an emergent condition requiring admission or immediate intervention beyond what has been performed at this time. The plan is: Charged home with symptomatic management of likely right elbow versus shoulder sprain.  Given sling for comfort.  Recommended OTC meds as needed for pain.  Recommended follow-up with orthopedic if symptoms do not improve.. The patient is safe for discharge and has been instructed to return immediately for worsening symptoms, change in symptoms or any other concerns.  Final Clinical Impression(s) / ED Diagnoses Final diagnoses:  Right arm pain    Rx / DC Orders ED Discharge Orders     None      Portions of this report may have been transcribed using voice recognition software. Every effort was made to ensure accuracy; however, inadvertent computerized transcription errors may be present.    Estill Cotta 07/23/22 2352     Drenda Freeze, MD 07/24/22 2240

## 2022-07-23 NOTE — ED Triage Notes (Signed)
Pt arrives with c/o right shoulder and elbow pain after falling. Pt denies LOC. Pt able to move extremity.

## 2022-11-14 ENCOUNTER — Other Ambulatory Visit: Payer: Self-pay

## 2022-11-14 ENCOUNTER — Emergency Department (HOSPITAL_COMMUNITY): Payer: BC Managed Care – PPO

## 2022-11-14 ENCOUNTER — Emergency Department (HOSPITAL_COMMUNITY)
Admission: EM | Admit: 2022-11-14 | Discharge: 2022-11-14 | Disposition: A | Discharge status: Home / Self Care | Payer: BC Managed Care – PPO | Attending: Emergency Medicine | Admitting: Emergency Medicine

## 2022-11-14 DIAGNOSIS — M25571 Pain in right ankle and joints of right foot: Secondary | ICD-10-CM | POA: Diagnosis present

## 2022-11-14 MED ORDER — ACETAMINOPHEN 500 MG PO TABS
1000.0000 mg | ORAL_TABLET | Freq: Once | ORAL | Status: AC
  Administered 2022-11-14: 1000 mg via ORAL
  Filled 2022-11-14: qty 2

## 2022-11-14 NOTE — ED Notes (Addendum)
PT left without brace, she did not tell anyone, ortho was called and at bedside to apply brace, pt was not present. Provider notified.

## 2022-11-14 NOTE — Discharge Instructions (Addendum)
Please follow-up with your primary care provider or the primary care provider I have attached your for you in regards to your recent ER visit.  Today your x-rays were negative and you most likely have an ankle sprain.  You may use Tylenol every 6 hours as needed for pain.  Please ice the area and elevate the leg when you are lying down.  If symptoms change or worsen please return to ER.

## 2022-11-14 NOTE — Progress Notes (Signed)
Orthopedic Tech Progress Note Patient Details:  Jane Allen Feb 09, 2001 [id number]  Went to service patient and she was left AMA per RN   Patient ID: Jane Allen, female   DOB: 2001-03-03, 22 y.o.   MRN: [id number]  Jane Allen 11/14/2022, 11:50 AM

## 2022-11-14 NOTE — ED Triage Notes (Signed)
Patient reports R ankle pain for several weeks. Denies injury. Ambulatory.

## 2022-11-14 NOTE — ED Provider Notes (Signed)
[place] EMERGENCY DEPARTMENT AT [facility] Provider Note   CSN: [id number] Arrival date & time: 11/14/22  1018     History  Chief Complaint  Patient presents with   Ankle Pain    Jane Allen is a 22 y.o. female presented with right ankle pain for the past 2 weeks.  Patient is unsure what happened to her ankle and is unsure if she fell or rolled her ankle.  Patient states that over the past 2 days the pain has gotten worse around her ankle as she went to the beach.  Patient that she has been able to walk the past 2 weeks but that it exacerbates her pain.  Patient states she is still able to feel her foot and denies any skin color changes or swelling.  Patient does not tried Tylenol or ibuprofen at this time.   Home Medications Prior to Admission medications   Medication Sig Start Date End Date Taking? Authorizing Provider  diclofenac Sodium (VOLTAREN) 1 % GEL Apply 2 g topically 4 (four) times daily. 06/23/20   Mare Ferrari, PA-C  lidocaine (LIDODERM) 5 % Place 1 patch onto the skin daily. Remove & Discard patch within 12 hours or as directed by MD 05/13/20   Henderly, Britni A, PA-C  naproxen (NAPROSYN) 500 MG tablet Take 1 tablet (500 mg total) by mouth 2 (two) times daily. 05/13/20   Henderly, Britni A, PA-C      Allergies    Doxycycline    Review of Systems   Review of Systems Right ankle pain Physical Exam Updated Vital Signs BP (!) 140/74 (BP Location: Right Arm)   Pulse 78   Temp 98.5 F (36.9 C) (Oral)   Resp 16   LMP 11/07/2022   SpO2 99%  Physical Exam Constitutional:      General: She is not in acute distress. Cardiovascular:     Pulses: Normal pulses.  Musculoskeletal:        General: Normal range of motion.     Comments: Tender to palpation on ATF ligament No step-off/crepitus/abnormalities palpated Compartments soft  Skin:    General: Skin is warm and dry.     Capillary Refill: Capillary refill takes less than 2 seconds.      Comments: No overlying skin color changes  Neurological:     Mental Status: She is alert.     Comments: Sensation intact distally     ED Results / Procedures / Treatments   Labs (all labs ordered are listed, but only abnormal results are displayed) Labs Reviewed - No data to display  EKG None  Radiology DG Ankle Complete Right  Result Date: 11/14/2022 CLINICAL DATA:  Right ankle pain and swelling for 1 week. EXAM: RIGHT ANKLE - COMPLETE 3+ VIEW COMPARISON:  None Available. FINDINGS: There is no evidence of fracture, dislocation, or joint effusion. There is no evidence of arthropathy or other focal bone abnormality. Soft tissues are unremarkable. IMPRESSION: Negative. Electronically Signed   By: Emmaline Kluver M.D.   On: 11/14/2022 11:23    Procedures Procedures    Medications Ordered in ED Medications  acetaminophen (TYLENOL) tablet 1,000 mg (1,000 mg Oral Given 11/14/22 1119)    ED Course/ Medical Decision Making/ A&P                             Medical Decision Making  Tristy Vaden 23 y.o. presented today for right ankle pain. Working DDx that  I considered at this time includes, but not limited to, strain/sprain, fracture, dislocation, neurovascular compromise, ischemic limb, compartment syndrome.  R/o DDx:  fracture, dislocation, neurovascular compromise, ischemic limb, compartment syndrome: These are considered less likely due to history of present illness and physical exam findings  Review of prior external notes: 07/23/2022 ED  Unique Tests and My Interpretation:  Right ankle x-ray:  Discussion with Independent Historian: None  Discussion of Management of Tests: None  Risk: Medium: prescription drug management  Risk Stratification Score: None  Plan: On exam patient was in no acute distress and stable vitals.  Patient was tender in her right ATF ligament but otherwise was neurovascularly intact.  Patient was given Tylenol and x-ray was ordered.  I suspect   at this time patient has ankle sprain.  Patient stable this time.  Patient's x-rays are negative.  Patient be given ASO ankle brace with primary care follow-up.  I encouraged patient to ice, rest, elevate her right ankle for symptom management and that she may take Tylenol every 6 hours as needed for pain.  Patient was given return precautions. Patient stable for discharge at this time.  Patient verbalized understanding of plan.         Final Clinical Impression(s) / ED Diagnoses Final diagnoses:  Acute right ankle pain    Rx / DC Orders ED Discharge Orders     None         Remi Deter 11/14/22 1130    Rondel Baton, MD 11/15/22 [phone]

## 2023-06-16 ENCOUNTER — Encounter (HOSPITAL_BASED_OUTPATIENT_CLINIC_OR_DEPARTMENT_OTHER): Payer: Self-pay | Admitting: Emergency Medicine

## 2023-06-16 ENCOUNTER — Emergency Department (HOSPITAL_BASED_OUTPATIENT_CLINIC_OR_DEPARTMENT_OTHER)
Admission: EM | Admit: 2023-06-16 | Discharge: 2023-06-16 | Disposition: A | Discharge status: Home / Self Care | Payer: BC Managed Care – PPO | Attending: Emergency Medicine | Admitting: Emergency Medicine

## 2023-06-16 DIAGNOSIS — K047 Periapical abscess without sinus: Secondary | ICD-10-CM

## 2023-06-16 DIAGNOSIS — K0889 Other specified disorders of teeth and supporting structures: Secondary | ICD-10-CM | POA: Diagnosis present

## 2023-06-16 MED ORDER — NAPROXEN 500 MG PO TABS: 500.0000 mg | ORAL_TABLET | Freq: Two times a day (BID) | ORAL | 0 refills | Status: AC

## 2023-06-16 MED ORDER — AMOXICILLIN 500 MG PO CAPS: 500.0000 mg | ORAL_CAPSULE | Freq: Three times a day (TID) | ORAL | 0 refills | Status: AC

## 2023-06-16 NOTE — ED Provider Notes (Signed)
[place] EMERGENCY DEPARTMENT AT [facility] Provider Note   CSN: [id number] Arrival date & time: 06/16/23  1445     History  Chief Complaint  Patient presents with   Dental Pain    Jane Allen is a 23 y.o. female.  Patient is a 23 year old female who presents emergency department with chief complaint of right upper dental pain which has been ongoing for approximate the past 3 days.  Patient notes that she has had no associated stridor, dysphagia, drooling, dyspnea.  She denies any associated fever or chills.  There has been no associate abdominal pain, nausea, vomiting, diarrhea.  She notes that she does have a dentist that she follows with but has not made an appointment to see them as of yet.   Dental Pain      Home Medications Prior to Admission medications   Medication Sig Start Date End Date Taking? Authorizing Provider  diclofenac Sodium (VOLTAREN) 1 % GEL Apply 2 g topically 4 (four) times daily. 06/23/20   Mare Ferrari, PA-C  lidocaine (LIDODERM) 5 % Place 1 patch onto the skin daily. Remove & Discard patch within 12 hours or as directed by MD 05/13/20   Henderly, Britni A, PA-C  naproxen (NAPROSYN) 500 MG tablet Take 1 tablet (500 mg total) by mouth 2 (two) times daily. 05/13/20   Henderly, Britni A, PA-C      Allergies    Doxycycline    Review of Systems   Review of Systems  HENT:         Dental pain  All other systems reviewed and are negative.   Physical Exam Updated Vital Signs BP 130/76 (BP Location: Right Arm)   Pulse (!) 104   Temp 97.7 F (36.5 C)   Resp 18   Ht 5\' 1"  (1.549 m)   Wt 67.6 kg   LMP 06/04/2023   SpO2 99%   BMI 28.15 kg/m  Physical Exam Constitutional:      Appearance: Normal appearance.  HENT:     Head: Normocephalic and atraumatic.     Nose: Nose normal.     Mouth/Throat:     Mouth: Mucous membranes are moist.     Comments: Swelling noted to right upper gumline with no areas of induration or fluctuance,  floor mouth is soft, tolerance accretions without difficulty, no peritonsillar swelling Eyes:     Extraocular Movements: Extraocular movements intact.     Conjunctiva/sclera: Conjunctivae normal.     Pupils: Pupils are equal, round, and reactive to light.  Cardiovascular:     Rate and Rhythm: Normal rate and regular rhythm.  Pulmonary:     Effort: Pulmonary effort is normal.  Musculoskeletal:        General: Normal range of motion.     Cervical back: Normal range of motion and neck supple. No rigidity or tenderness.  Lymphadenopathy:     Cervical: No cervical adenopathy.  Skin:    General: Skin is warm and dry.  Neurological:     General: No focal deficit present.     Mental Status: She is alert and oriented to person, place, and time. Mental status is at baseline.  Psychiatric:        Mood and Affect: Mood normal.        Behavior: Behavior normal.        Thought Content: Thought content normal.        Judgment: Judgment normal.     ED Results / Procedures / Treatments  Labs (all labs ordered are listed, but only abnormal results are displayed) Labs Reviewed - No data to display  EKG None  Radiology No results found.  Procedures Procedures    Medications Ordered in ED Medications - No data to display  ED Course/ Medical Decision Making/ A&P                                 Medical Decision Making Patient is doing well at this time and is stable for discharge home.  Discussed with patient that we will treat her for dental infection at this point.  Patient has no indication for sepsis at this time.  Physical exam demonstrates no indication for acute peritonsillar abscess, Ludwig angina, retropharyngeal abscess, epiglottitis.  Patient has no signs of acute airway compromise or respiratory distress at this time.  The importance of close follow-up with her dentist on outpatient basis for reevaluation was discussed.  Strict return precautions were provided for any new or  worsening symptoms.  Patient voiced understanding and had no additional questions.  Risk Prescription drug management.           Final Clinical Impression(s) / ED Diagnoses Final diagnoses:  None    Rx / DC Orders ED Discharge Orders     None         Lelon Perla, PA-C 06/16/23 1649    Vanetta Mulders, MD 06/16/23 2355

## 2023-06-16 NOTE — ED Triage Notes (Signed)
Pt c/o dental pain (RT upper) x 2d

## 2023-06-16 NOTE — Discharge Instructions (Addendum)
Please take all antibiotics as directed.  Please call to make an appointment with your dentist for reevaluation.  Return to emergency department immediately for any new or worsening symptoms.

## 2023-06-16 NOTE — ED Notes (Signed)
Called in [facility] x 1, no answer
